# Patient Record
Sex: Male | Born: 2005 | Race: White | Hispanic: No | Marital: Single
Health system: Southern US, Community
[De-identification: ages and names within clinical notes are randomized; demographics above are authoritative.]

## PROBLEM LIST (undated history)

## (undated) DIAGNOSIS — J039 Acute tonsillitis, unspecified: Secondary | ICD-10-CM

## (undated) DIAGNOSIS — Z8489 Family history of other specified conditions: Secondary | ICD-10-CM

## (undated) DIAGNOSIS — B974 Respiratory syncytial virus as the cause of diseases classified elsewhere: Secondary | ICD-10-CM

## (undated) DIAGNOSIS — R519 Headache, unspecified: Secondary | ICD-10-CM

## (undated) DIAGNOSIS — F419 Anxiety disorder, unspecified: Secondary | ICD-10-CM

## (undated) DIAGNOSIS — R51 Headache: Secondary | ICD-10-CM

## (undated) DIAGNOSIS — J45909 Unspecified asthma, uncomplicated: Secondary | ICD-10-CM

## (undated) DIAGNOSIS — J342 Deviated nasal septum: Secondary | ICD-10-CM

## (undated) DIAGNOSIS — B338 Other specified viral diseases: Secondary | ICD-10-CM

---
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
## 2024-01-05 ENCOUNTER — Telehealth: Payer: MEDICAID | Admitting: Child and Adolescent Psychiatry

## 2024-01-05 DIAGNOSIS — F411 Generalized anxiety disorder: Secondary | ICD-10-CM

## 2024-01-05 DIAGNOSIS — F3341 Major depressive disorder, recurrent, in partial remission: Secondary | ICD-10-CM | POA: Diagnosis not present

## 2024-01-05 DIAGNOSIS — F4310 Post-traumatic stress disorder, unspecified: Secondary | ICD-10-CM | POA: Diagnosis not present
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
## 2024-02-11 ENCOUNTER — Other Ambulatory Visit: Payer: Self-pay | Admitting: Child and Adolescent Psychiatry

## 2024-02-11 DIAGNOSIS — F411 Generalized anxiety disorder: Secondary | ICD-10-CM

## 2024-02-11 DIAGNOSIS — F3341 Major depressive disorder, recurrent, in partial remission: Secondary | ICD-10-CM

## 2024-02-12 ENCOUNTER — Other Ambulatory Visit: Payer: Self-pay | Admitting: Child and Adolescent Psychiatry

## 2024-02-13 ENCOUNTER — Telehealth: Payer: Self-pay

## 2024-02-13 NOTE — Telephone Encounter (Signed)
 pt mother called states son needs a refill on the prozac . pt mother was told that dr. Mee Spillers sent rx today at 11:37 to the [facility] in [place]. she stated that she would contact them and see.

## 2024-02-16 NOTE — Telephone Encounter (Signed)
 Clifford thanks

## 2024-02-19 ENCOUNTER — Emergency Department
Admission: EM | Admit: 2024-02-19 | Discharge: 2024-02-19 | Disposition: A | Discharge status: Home / Self Care | Attending: Emergency Medicine | Admitting: Emergency Medicine

## 2024-02-19 ENCOUNTER — Other Ambulatory Visit: Payer: Self-pay

## 2024-02-19 DIAGNOSIS — J45909 Unspecified asthma, uncomplicated: Secondary | ICD-10-CM | POA: Diagnosis not present

## 2024-02-19 DIAGNOSIS — Z041 Encounter for examination and observation following transport accident: Secondary | ICD-10-CM | POA: Insufficient documentation
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
## 2024-02-23 ENCOUNTER — Telehealth (INDEPENDENT_AMBULATORY_CARE_PROVIDER_SITE_OTHER): Admitting: Child and Adolescent Psychiatry

## 2024-02-23 DIAGNOSIS — F3341 Major depressive disorder, recurrent, in partial remission: Secondary | ICD-10-CM | POA: Diagnosis not present

## 2024-02-23 DIAGNOSIS — F411 Generalized anxiety disorder: Secondary | ICD-10-CM | POA: Diagnosis not present

## 2024-02-23 MED ORDER — HYDROXYZINE HCL 25 MG PO TABS: 12.5000 mg | ORAL_TABLET | Freq: Every evening | ORAL | 0 refills | Status: AC | PRN
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
## 2024-05-01 ENCOUNTER — Ambulatory Visit: Admitting: Psychiatry
